# Patient Record
Sex: Male | Born: 1970 | Race: Black or African American | Hispanic: No | Marital: Single
Health system: Southern US, Community
[De-identification: ages and names within clinical notes are randomized; demographics above are authoritative.]

---

## 2002-08-28 ENCOUNTER — Encounter: Payer: Self-pay | Admitting: Emergency Medicine

## 2002-08-28 ENCOUNTER — Emergency Department (HOSPITAL_COMMUNITY): Admission: EM | Admit: 2002-08-28 | Discharge: 2002-08-28 | Payer: Self-pay | Admitting: Emergency Medicine

## 2003-05-10 ENCOUNTER — Emergency Department (HOSPITAL_COMMUNITY): Admission: AD | Admit: 2003-05-10 | Discharge: 2003-05-10 | Payer: Self-pay | Admitting: Family Medicine

## 2006-10-17 ENCOUNTER — Emergency Department: Payer: Self-pay | Admitting: Internal Medicine

## 2010-08-25 ENCOUNTER — Emergency Department: Payer: Self-pay

## 2010-08-28 ENCOUNTER — Emergency Department: Payer: Self-pay | Admitting: Emergency Medicine

## 2011-12-09 ENCOUNTER — Emergency Department: Payer: Self-pay | Admitting: Emergency Medicine

## 2012-07-20 IMAGING — CR DG SHOULDER 3+V*L*
1 series · 3 of 3 positions shown · non-contrast
Comparison: none

REASON FOR EXAM: pain
COMMENTS:

[Series 1: view not recorded · 0.17mm/px · 3 of 3 slices shown]
[im 1/3]
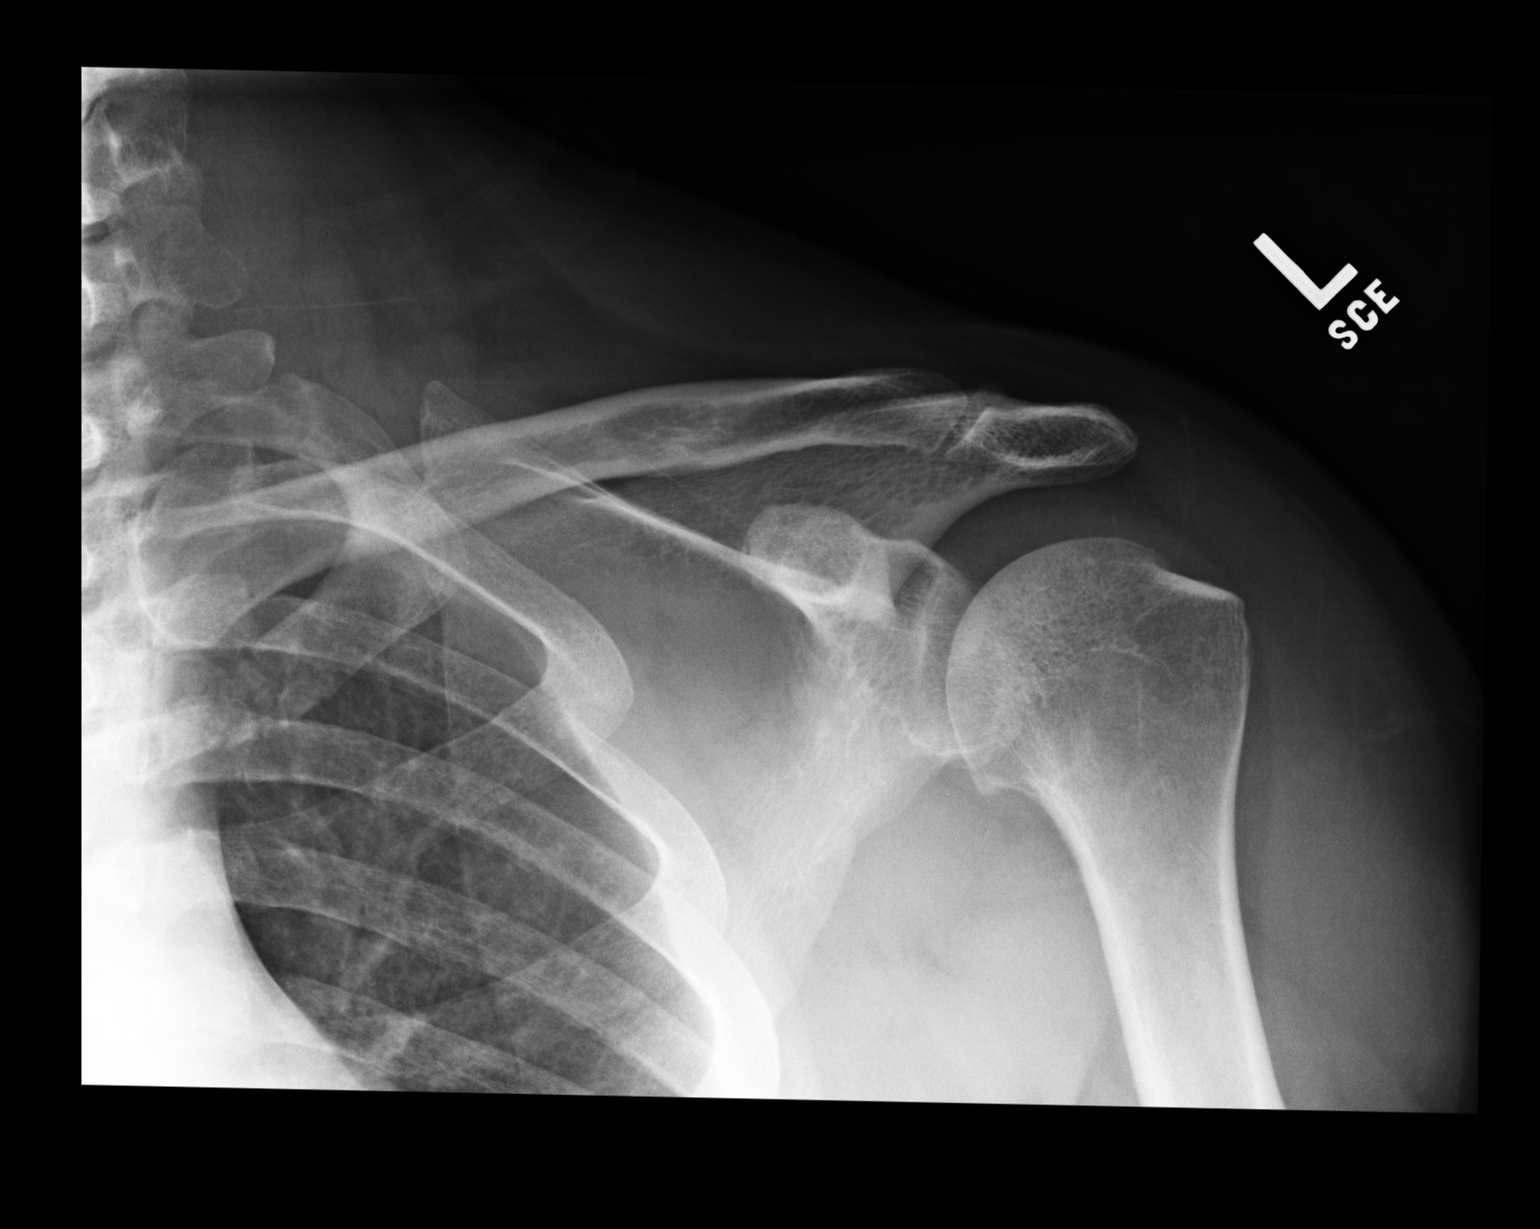
[im 2/3]
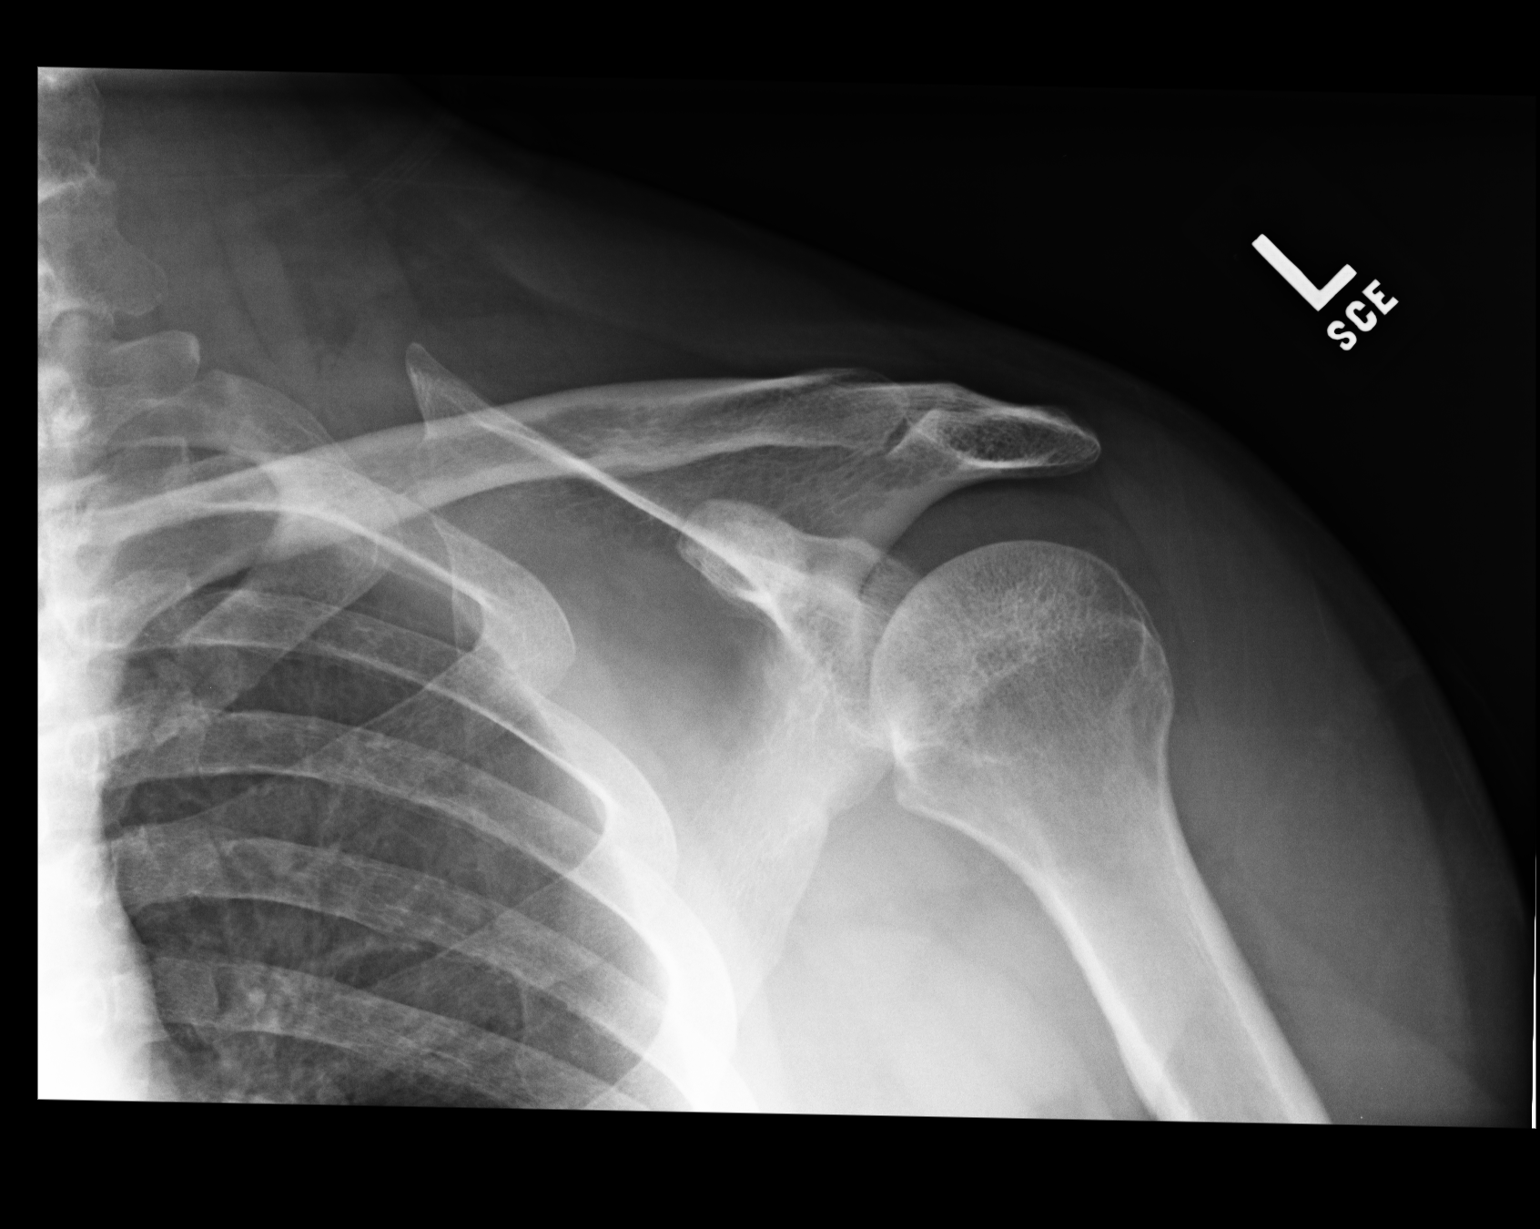
[im 3/3]
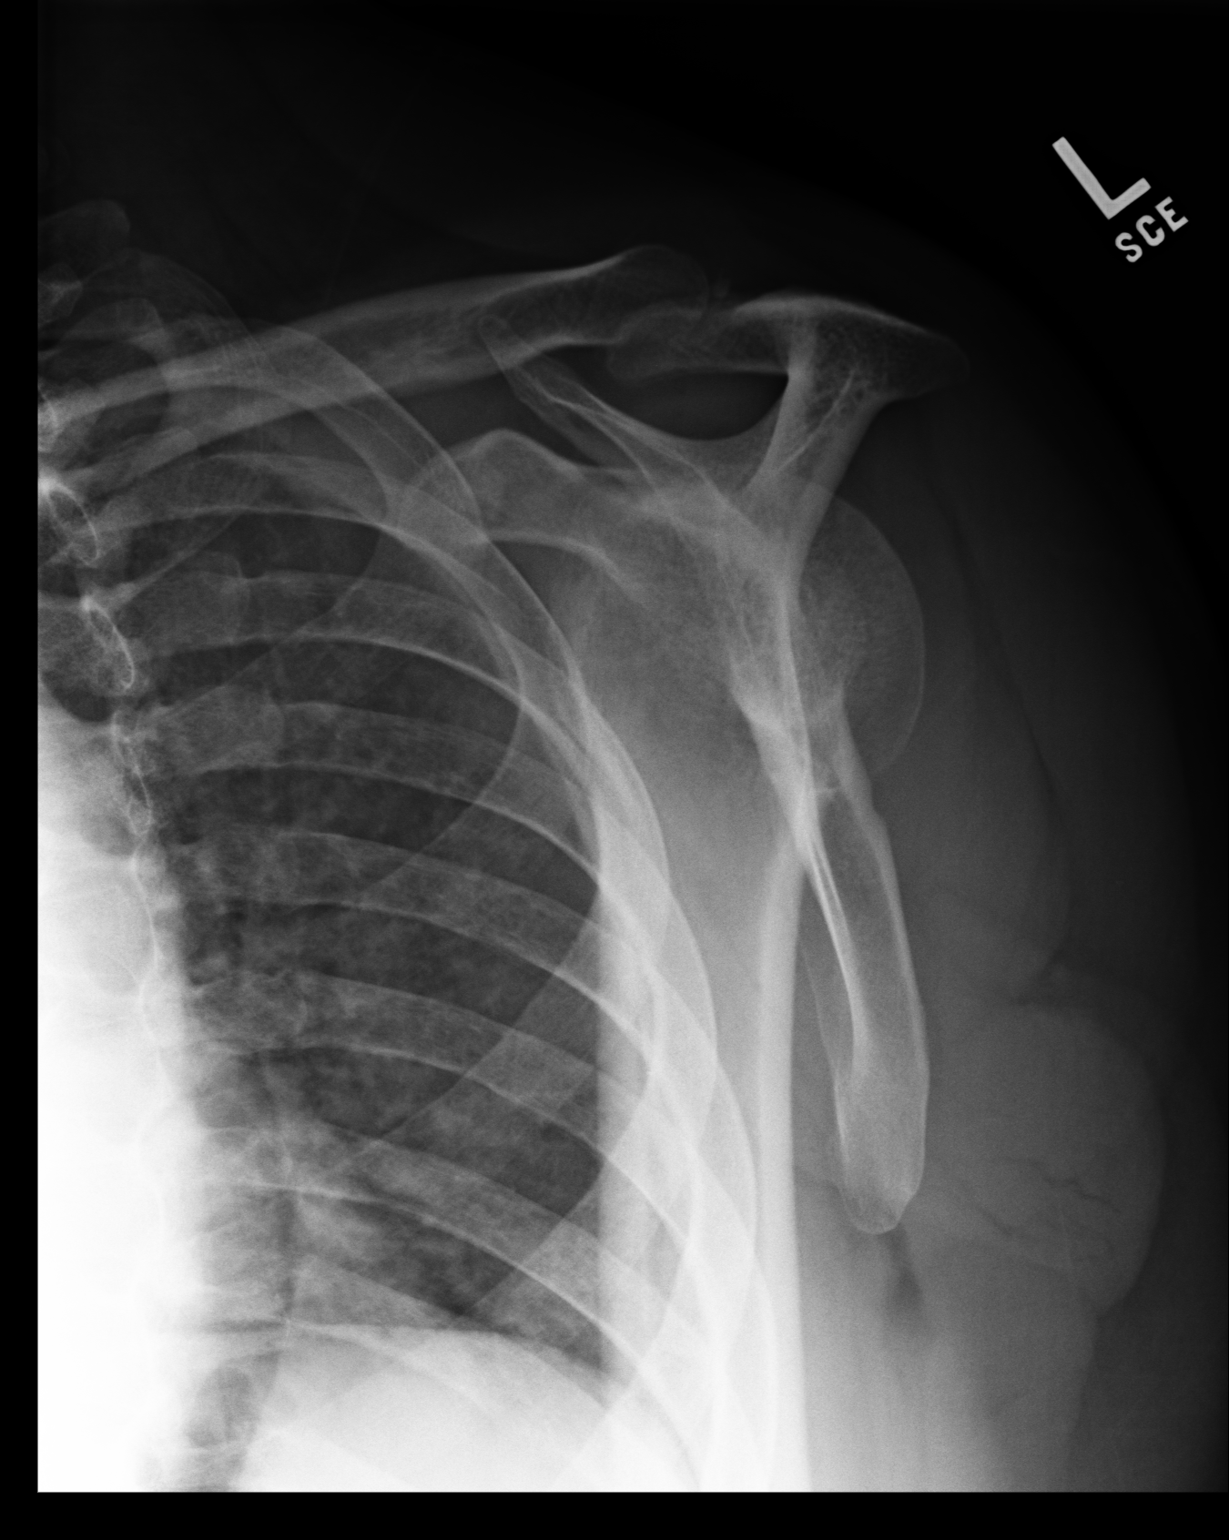

[3 of 3 positions shown; findings below may reference images not displayed]

PROCEDURE:     DXR - DXR SHOULDER LEFT COMPLETE  - August 28, 2010  [DATE]

RESULT:     Three views of the left shoulder are submitted. The bones appear
adequately mineralized. There is a small spur from the inferior margin of
the articular surface of the humeral head. The glenohumeral joint is
preserved and the AC joint appears normal.
IMPRESSION: I do not see acute bony abnormality of the left shoulder.

## 2012-07-20 IMAGING — CR DG LUMBAR SPINE 2-3V
1 series · 3 of 3 positions shown · non-contrast
Comparison: none

REASON FOR EXAM: pain
COMMENTS:

[Series 1: view not recorded · 0.17mm/px · 3 of 3 slices shown]
[im 1/3]
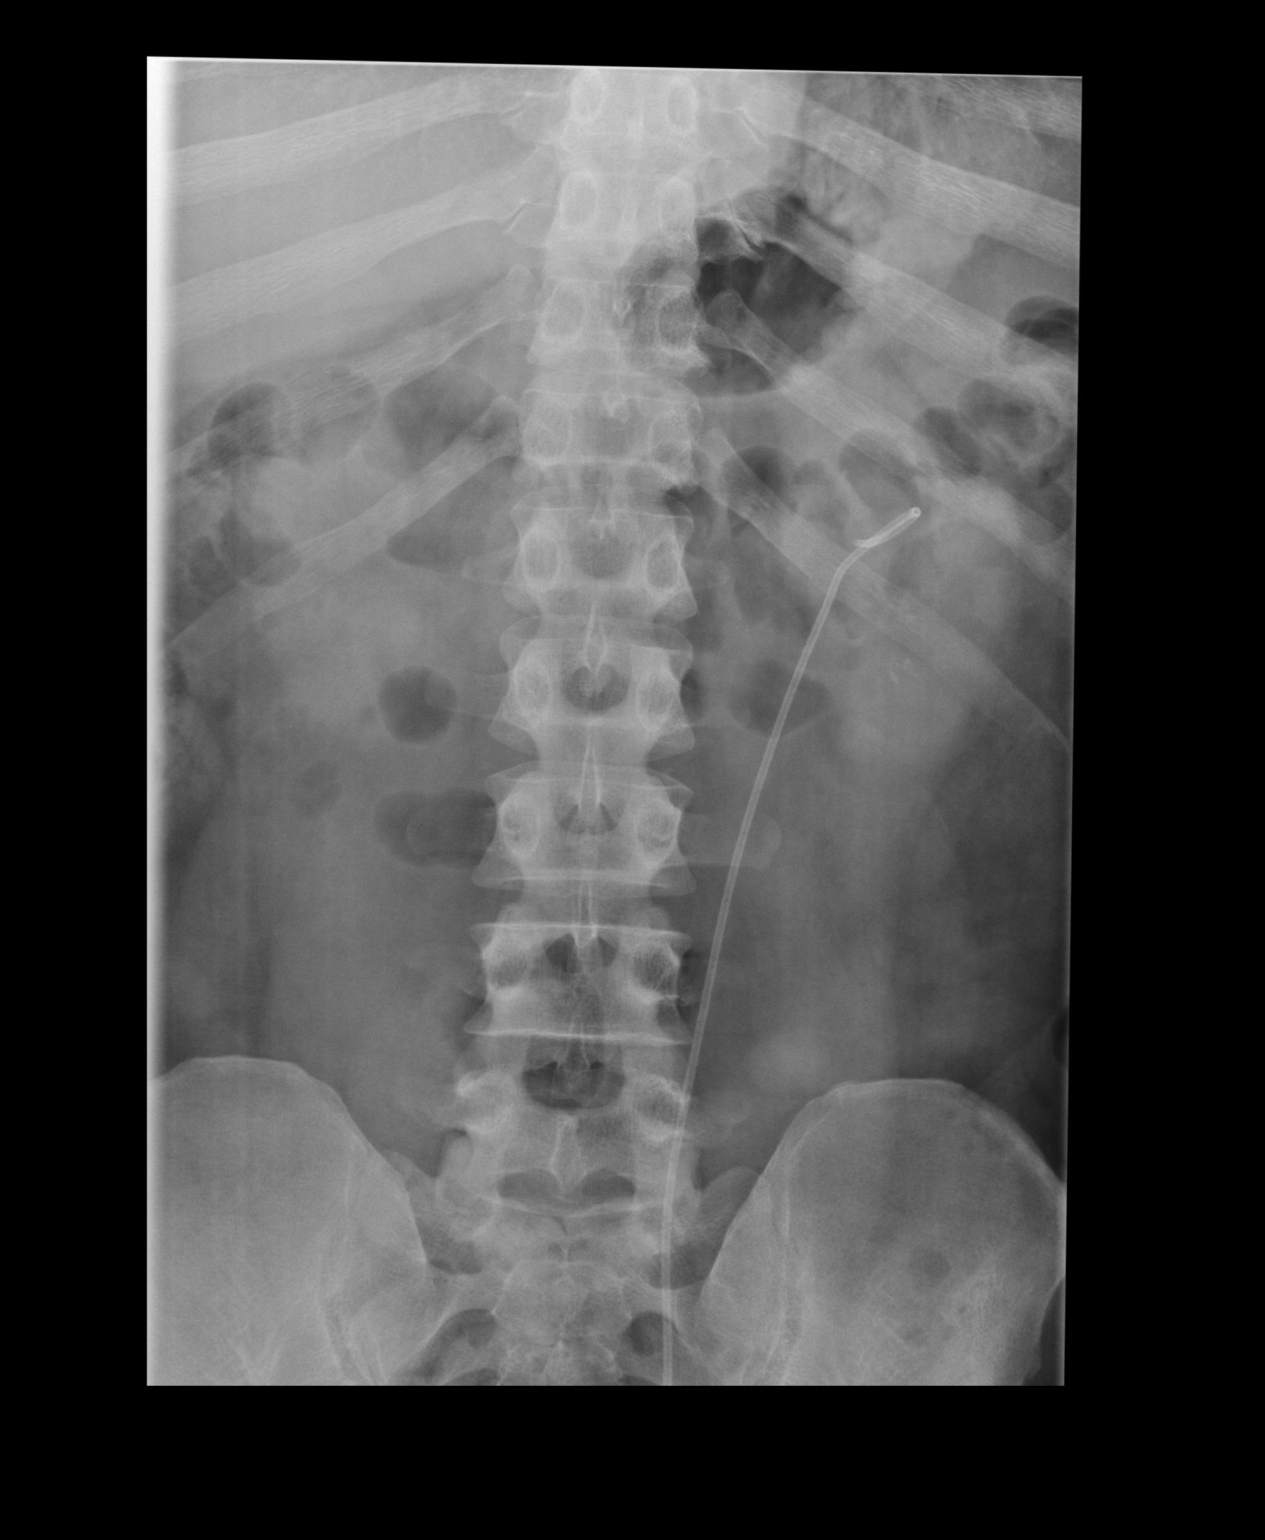
[im 2/3]
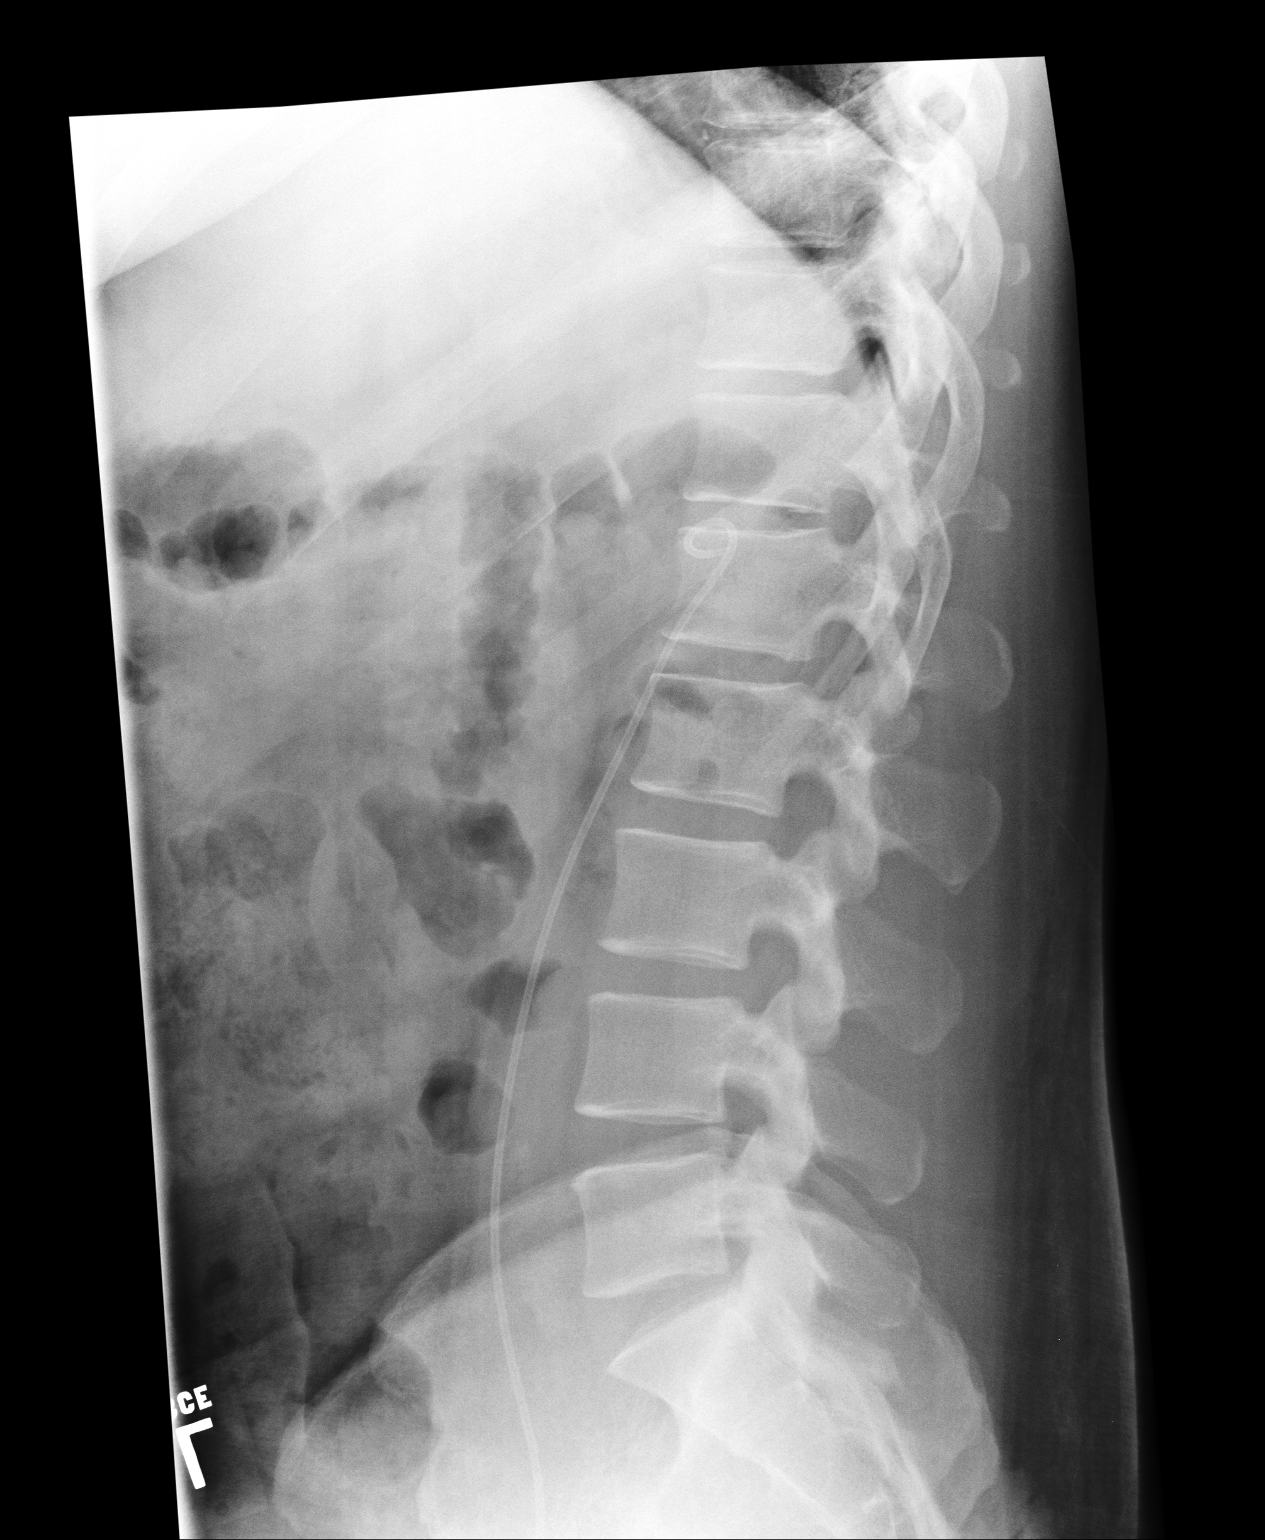
[im 3/3]
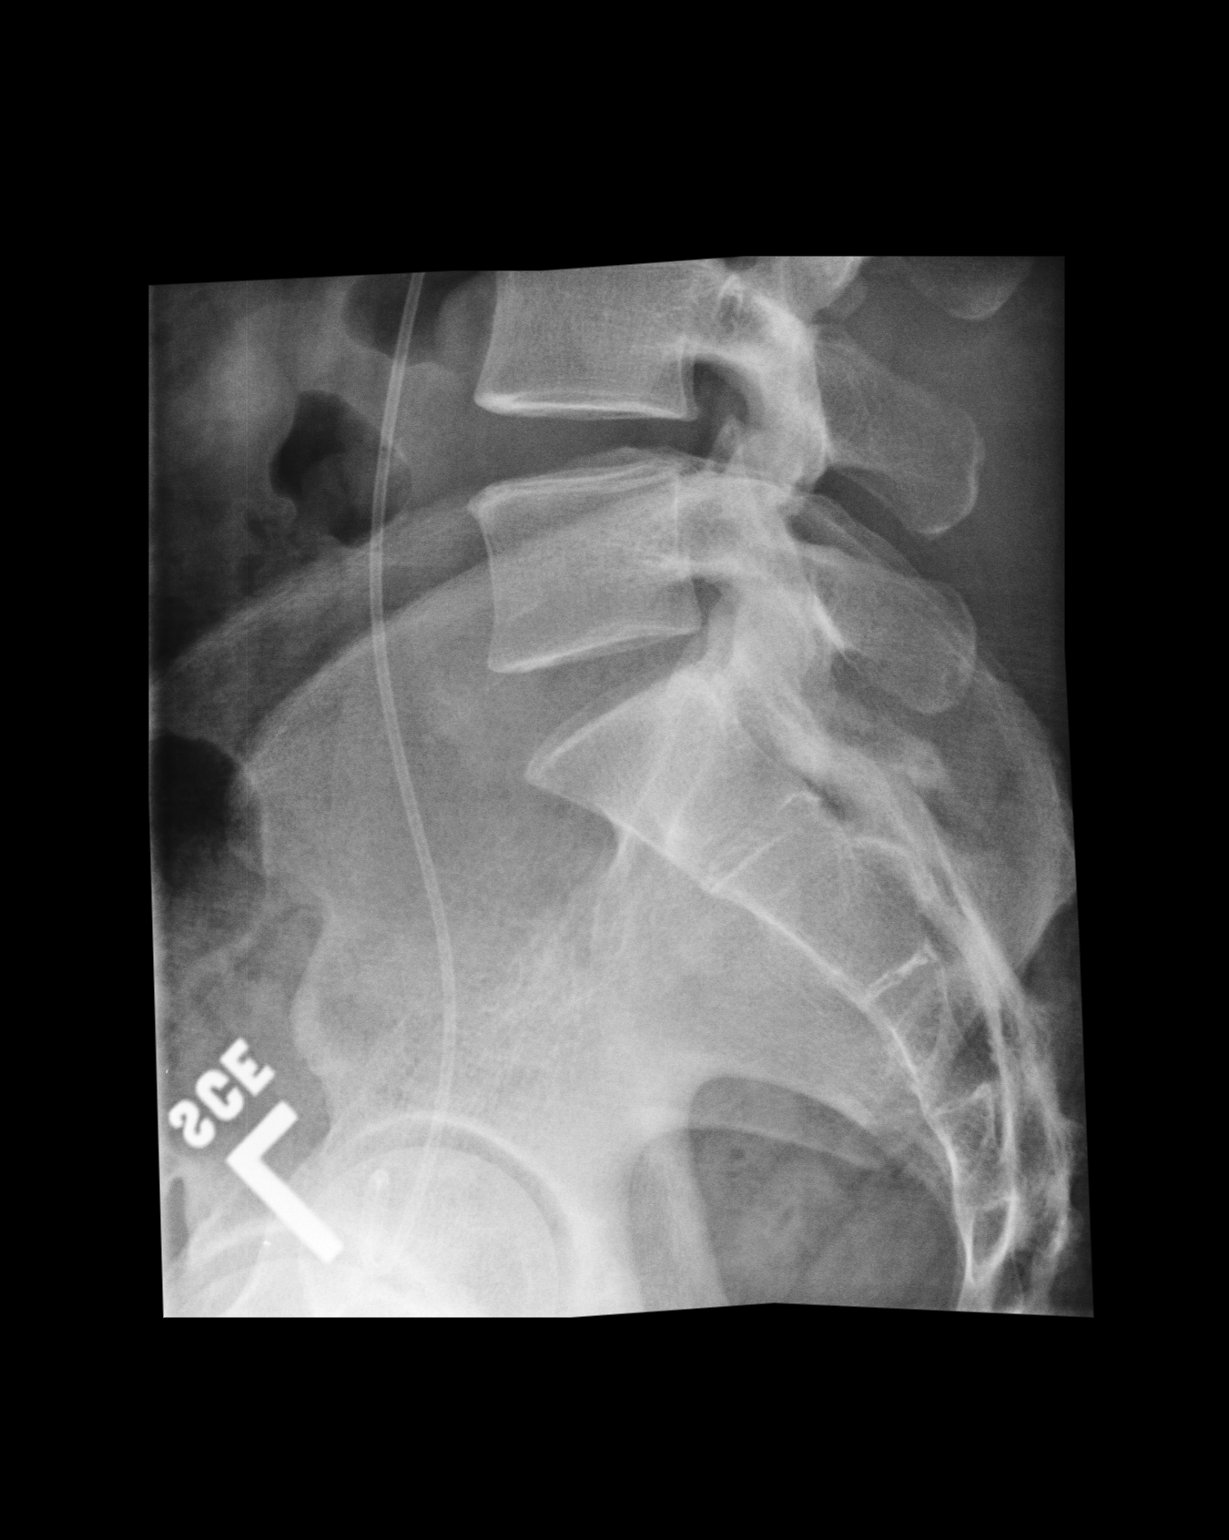

[3 of 3 positions shown; findings below may reference images not displayed]

PROCEDURE:     DXR - DXR LUMBAR SPINE AP AND LATERAL  - August 28, 2010  [DATE]

RESULT:     The lumbar vertebral bodies are preserved in height. The
intervertebral disc space heights are well-maintained. I see no
spondylolisthesis. The transverse processes and pedicles appear intact. The
spinous processes are intact. A double-J ureteral stent is in place on the
left.
IMPRESSION: I see no acute bony abnormality of the lumbar spine. There
is no evidence of significant degenerative change.

## 2013-08-09 ENCOUNTER — Ambulatory Visit: Payer: Self-pay | Admitting: Internal Medicine

## 2013-08-09 LAB — DOT URINE DIP
BLOOD: NEGATIVE
GLUCOSE, UR: NEGATIVE mg/dL (ref 0–75)
PROTEIN: NEGATIVE
Specific Gravity: 1.005 (ref 1.003–1.030)

## 2019-09-30 ENCOUNTER — Other Ambulatory Visit: Payer: Self-pay

## 2019-09-30 ENCOUNTER — Emergency Department
Admission: EM | Admit: 2019-09-30 | Discharge: 2019-09-30 | Disposition: A | Discharge status: Home / Self Care | Payer: Self-pay | Attending: Emergency Medicine | Admitting: Emergency Medicine

## 2019-09-30 DIAGNOSIS — Z79899 Other long term (current) drug therapy: Secondary | ICD-10-CM | POA: Insufficient documentation

## 2019-09-30 DIAGNOSIS — K047 Periapical abscess without sinus: Secondary | ICD-10-CM | POA: Insufficient documentation

## 2019-09-30 LAB — BASIC METABOLIC PANEL
Anion gap: 9 (ref 5–15)
BUN: 13 mg/dL (ref 6–20)
CO2: 23 mmol/L (ref 22–32)
Calcium: 8.6 mg/dL — ABNORMAL LOW (ref 8.9–10.3)
Chloride: 102 mmol/L (ref 98–111)
Creatinine, Ser: 1.18 mg/dL (ref 0.61–1.24)
GFR calc Af Amer: >60 mL/min (ref >60)
GFR calc non Af Amer: >60 mL/min (ref >60)
Glucose, Bld: 111 mg/dL — ABNORMAL HIGH (ref 70–99)
Potassium: 4.1 mmol/L (ref 3.5–5.1)
Sodium: 134 mmol/L — ABNORMAL LOW (ref 135–145)

## 2019-09-30 LAB — CBC
HCT: 38.4 % — ABNORMAL LOW (ref 39.0–52.0)
Hemoglobin: 12.9 g/dL — ABNORMAL LOW (ref 13.0–17.0)
MCH: 29 pg (ref 26.0–34.0)
MCHC: 33.6 g/dL (ref 30.0–36.0)
MCV: 86.3 fL (ref 80.0–100.0)
Platelets: 326 10*3/uL (ref 150–400)
RBC: 4.45 MIL/uL (ref 4.22–5.81)
RDW: 13.5 % (ref 11.5–15.5)
WBC: 11.5 10*3/uL — ABNORMAL HIGH (ref 4.0–10.5)
nRBC: 0 % (ref 0.0–0.2)

## 2019-09-30 MED ORDER — AMOXICILLIN-POT CLAVULANATE 875-125 MG PO TABS: 1.0000 | ORAL_TABLET | Freq: Two times a day (BID) | ORAL | 0 refills | Status: AC

## 2019-09-30 MED ORDER — TRAMADOL HCL 50 MG PO TABS: 50.0000 mg | ORAL_TABLET | Freq: Four times a day (QID) | ORAL | 0 refills | Status: DC | PRN

## 2019-09-30 MED ORDER — AMOXICILLIN-POT CLAVULANATE 875-125 MG PO TABS
1.0000 | ORAL_TABLET | Freq: Once | ORAL | Status: AC
  Administered 2019-09-30: 1 via ORAL
  Filled 2019-09-30: qty 1

## 2019-09-30 NOTE — ED Notes (Signed)
See triage note  Presents with facial swelling   States started couple of days ago   No fever

## 2019-09-30 NOTE — ED Provider Notes (Signed)
[facility] Emergency Department Provider Note   ____________________________________________    I have reviewed the triage vital signs and the nursing notes.   HISTORY  Chief Complaint Facial Swelling     HPI David Huerta is a 49 y.o. male who presents with complaints of right-sided facial swelling and discomfort.  Patient reports symptoms started several days ago, does report tenderness in the area and along lower lateral teeth.  Denies fevers or chills.  No difficulty swallowing.  No tongue swelling.  Reports similar dental pain in the past.  Has not take anything for this  History reviewed. No pertinent past medical history.  There are no problems to display for this patient.   History reviewed. No pertinent surgical history.  Prior to Admission medications   Medication Sig Start Date End Date Taking? Authorizing Provider  amoxicillin-clavulanate (AUGMENTIN) 875-125 MG tablet Take 1 tablet by mouth 2 (two) times daily for 7 days. 09/30/19 10/07/19  Jene Every, MD  traMADol (ULTRAM) 50 MG tablet Take 1 tablet (50 mg total) by mouth every 6 (six) hours as needed. 09/30/19 09/29/20  Jene Every, MD     Allergies Patient has no allergy information on record.  No family history on file.  Social History Social History   Tobacco Use  . Smoking status: Not on file  Substance Use Topics  . Alcohol use: Not on file  . Drug use: Not on file    Review of Systems  Constitutional: No fever/chills  ENT: As above   Gastrointestinal: No nausea, no vomiting.    Skin: Negative for rash. Neurological: Negative for headaches     ____________________________________________   PHYSICAL EXAM:  VITAL SIGNS: ED Triage Vitals  Enc Vitals Group     BP 09/30/19 0745 (!) 146/80     Pulse Rate 09/30/19 0745 99     Resp 09/30/19 0745 17     Temp 09/30/19 0745 98.8 F (37.1 C)     Temp Source 09/30/19 0745 Oral     SpO2 09/30/19 0745 97  %     Weight 09/30/19 0745 83.9 kg (185 lb)     Height 09/30/19 0745 1.676 m (5\' 6" )     Head Circumference --      Peak Flow --      Pain Score 09/30/19 0750 7     Pain Loc --      Pain Edu? --      Excl. in GC? --     Constitutional: Alert and oriented. No acute distress. Pleasant and interactive Eyes: Conjunctivae are normal.  Head: Atraumatic. Nose: No congestion/rhinnorhea. Mouth/Throat: Mucous membranes are moist.  Tenderness along right inferior gumline, no drainable abscess, pharynx normal, floor of mouth soft Cardiovascular: Normal rate, regular rhythm.  Respiratory: Normal respiratory effort.  No retractions.  Musculoskeletal: No lower extremity tenderness nor edema.   Neurologic:  Normal speech and language. No gross focal neurologic deficits are appreciated.   Skin:  Skin is warm, dry and intact. No rash noted.   ____________________________________________   LABS (all labs ordered are listed, but only abnormal results are displayed)  Labs Reviewed  CBC - Abnormal; Notable for the following components:      Result Value   WBC 11.5 (*)    Hemoglobin 12.9 (*)    HCT 38.4 (*)    All other components within normal limits  BASIC METABOLIC PANEL - Abnormal; Notable for the following components:   Sodium 134 (*)  Glucose, Bld 111 (*)    Calcium 8.6 (*)    All other components within normal limits   ____________________________________________  EKG   ____________________________________________  RADIOLOGY  None ____________________________________________   PROCEDURES  Procedure(s) performed: No  Procedures   Critical Care performed: No ____________________________________________   INITIAL IMPRESSION / ASSESSMENT AND PLAN / ED COURSE  Pertinent labs & imaging results that were available during my care of the patient were reviewed by me and considered in my medical decision making (see chart for details).  Patient presents with right-sided  facial swelling as described above.  Suspected odontogenic infection, less likely sialoadenitis  No evidence of abscess on exam, discussed with patient CT imaging, he would prefer to try antibiotics first which is reasonable.  Return precautions discussed  ____________________________________________   FINAL CLINICAL IMPRESSION(S) / ED DIAGNOSES  Final diagnoses:  Dental infection      NEW MEDICATIONS STARTED DURING THIS VISIT:  Discharge Medication List as of 09/30/2019 10:34 AM    START taking these medications   Details  amoxicillin-clavulanate (AUGMENTIN) 875-125 MG tablet Take 1 tablet by mouth 2 (two) times daily for 7 days., Starting Mon 09/30/2019, Until Mon 10/07/2019, Print    traMADol (ULTRAM) 50 MG tablet Take 1 tablet (50 mg total) by mouth every 6 (six) hours as needed., Starting Mon 09/30/2019, Until Tue 09/29/2020 at 2359, Print         Note:  This document was prepared using Dragon voice recognition software and may include unintentional dictation errors.   Jene Every, MD 09/30/19 1054

## 2019-09-30 NOTE — ED Triage Notes (Signed)
Pt comes via POV from home with c/o right sided facial swelling the patient states this has been going on for two days. Pt states it feels like it is in his jaw.

## 2019-10-14 ENCOUNTER — Ambulatory Visit: Payer: Self-pay | Attending: Internal Medicine

## 2019-10-14 DIAGNOSIS — Z23 Encounter for immunization: Secondary | ICD-10-CM

## 2019-10-14 NOTE — Progress Notes (Signed)
   Covid-19 Vaccination Clinic  Name:  David Huerta    MRN: [id number] DOB: [birth year]  10/14/2019  [name] was observed post Covid-19 immunization for 15 minutes without incident. He was provided with Vaccine Information Sheet and instruction to access the V-Safe system.   [name] was instructed to call 911 with any severe reactions post vaccine: Marland Kitchen Difficulty breathing  . Swelling of face and throat  . A fast heartbeat  . A bad rash all over body  . Dizziness and weakness   Immunizations Administered    Name Date Dose VIS Date Route   Pfizer COVID-19 Vaccine 10/14/2019 10:10 AM 0.3 mL 04/10/2018 Intramuscular   Manufacturer: [employer], [employer]   Lot: [id number]   NDC: [address]-9

## 2019-11-04 ENCOUNTER — Ambulatory Visit: Payer: Self-pay | Attending: Internal Medicine

## 2019-11-04 DIAGNOSIS — Z23 Encounter for immunization: Secondary | ICD-10-CM

## 2019-11-04 NOTE — Progress Notes (Signed)
   Covid-19 Vaccination Clinic  Name:  David Huerta    MRN: [id number] DOB: 16-Oct-1970  11/04/2019  Mr. David Huerta was observed post Covid-19 immunization for 15 minutes without incident. He was provided with Vaccine Information Sheet and instruction to access the V-Safe system.   Mr. David Huerta was instructed to call 911 with any severe reactions post vaccine: Marland Kitchen Difficulty breathing  . Swelling of face and throat  . A fast heartbeat  . A bad rash all over body  . Dizziness and weakness   Immunizations Administered    Name Date Dose VIS Date Route   Pfizer COVID-19 Vaccine 11/04/2019 10:07 AM 0.3 mL 04/10/2018 Intramuscular   Manufacturer: [employer], [employer]   Lot: [id number]   NDC: [address]-6

## 2020-04-29 ENCOUNTER — Encounter: Payer: Self-pay | Admitting: Adult Health

## 2020-04-29 ENCOUNTER — Other Ambulatory Visit: Payer: Self-pay

## 2020-04-29 ENCOUNTER — Ambulatory Visit: Payer: Medicaid Other | Admitting: Adult Health

## 2020-04-29 VITALS — BP 127/83 | HR 89 | Temp 98.3°F | Resp 16 | Wt 211.1 lb

## 2020-04-29 DIAGNOSIS — Z7689 Persons encountering health services in other specified circumstances: Secondary | ICD-10-CM

## 2020-04-29 DIAGNOSIS — K0889 Other specified disorders of teeth and supporting structures: Secondary | ICD-10-CM

## 2020-04-29 NOTE — Progress Notes (Signed)
New Patient Office Visit  Subjective:  Patient ID: David Huerta, male    DOB: 10-May-1970  Age: 50 y.o. MRN: [id number]  CC: No chief complaint on file.   HPI David Huerta 50 y/o male who has no significant past medical history, presents to establish care . He states that he's doing well . Currently, he c/o nocturia that started 2 weeks ago. He denies any urinary frequency, urgency, flank nor pelvic pain. He also c/o intermittent dental ache to his right lower manidibular teeth that has been going on for many months. He denies jaw pain, erythema or swelling. Overall, he states that he's doing well and offers no further complaint.        Social History   Socioeconomic History  . Marital status: Single    Spouse name: Not on file  . Number of children: Not on file  . Years of education: Not on file  . Highest education level: Not on file  Occupational History  . Not on file  Tobacco Use  . Smoking status: Not on file  . Smokeless tobacco: Not on file  Substance and Sexual Activity  . Alcohol use: Not on file  . Drug use: Not on file  . Sexual activity: Not on file  Other Topics Concern  . Not on file  Social History Narrative  . Not on file   Social Determinants of Health   Financial Resource Strain: Not on file  Food Insecurity: Not on file  Transportation Needs: Not on file  Physical Activity: Not on file  Stress: Not on file  Social Connections: Not on file  Intimate Partner Violence: Not on file    ROS Review of Systems  Constitutional: Negative.   HENT: Positive for dental problem.   Eyes: Negative.   Respiratory: Negative.   Cardiovascular: Negative.   Gastrointestinal: Negative.   Endocrine: Positive for polyuria.  Genitourinary: Negative.   Musculoskeletal: Negative.   Skin: Negative.   Neurological: Negative.   Hematological: Negative.   Psychiatric/Behavioral: Negative.     Objective:   Today's Vitals: BP 127/83 (BP Location: Right  Arm, Patient Position: Sitting, Cuff Size: Large)   Pulse 89   Temp 98.3 F (36.8 C)   Resp 16   Wt 211 lb 1.6 oz (95.8 kg)   SpO2 98%   BMI 34.07 kg/m   Physical Exam HENT:     Head: Normocephalic and atraumatic.     Mouth/Throat:     Lips: Pink.     Mouth: Mucous membranes are moist.     Dentition: Dental caries present. No dental tenderness, gingival swelling or dental abscesses.   Cardiovascular:     Rate and Rhythm: Normal rate and regular rhythm.     Pulses: Normal pulses.     Heart sounds: Normal heart sounds.  Pulmonary:     Effort: Pulmonary effort is normal.     Breath sounds: Normal breath sounds.  Abdominal:     General: Abdomen is flat. Bowel sounds are normal.     Palpations: Abdomen is soft.  Genitourinary:    Comments: Deferred per patient. Musculoskeletal:        General: Normal range of motion.     Cervical back: Normal range of motion.  Skin:    General: Skin is warm and dry.  Neurological:     General: No focal deficit present.     Mental Status: He is alert and oriented to person, place, and time. Mental status is at  baseline.  Psychiatric:        Mood and Affect: Mood normal.        Behavior: Behavior normal.        Thought Content: Thought content normal.        Judgment: Judgment normal.     Assessment & Plan:    1. Encounter to establish care - Routine lab will be checked. - CBC w/Diff; Future - Lipid panel; Future - Comp Met (CMET); Future - HgB A1c; Future - Urinalysis; Future - Urinalysis - HgB A1c - Comp Met (CMET) - Lipid panel - CBC w/Diff - PSA   2. Tooth ache - No erythema, swelling, tooth decay to right lower mandibular tooth. - Dental application provided -     Follow-up: Return in about 15 days (around 05/14/2020), or if symptoms worsen or fail to improve.   David Langsam Jerold Coombe, NP

## 2020-04-30 LAB — SPECIMEN STATUS

## 2020-04-30 LAB — URINALYSIS
Bilirubin, UA: NEGATIVE
Glucose, UA: NEGATIVE
Ketones, UA: NEGATIVE
Leukocytes,UA: NEGATIVE
Nitrite, UA: NEGATIVE
Protein,UA: NEGATIVE
RBC, UA: NEGATIVE
Specific Gravity, UA: 1.022 (ref 1.005–1.030)
Urobilinogen, Ur: 1 mg/dL (ref 0.2–1.0)
pH, UA: 7 (ref 5.0–7.5)

## 2020-05-01 LAB — CBC WITH DIFFERENTIAL/PLATELET
Basophils Absolute: 0 10*3/uL (ref 0.0–0.2)
Basos: 0 %
EOS (ABSOLUTE): 0.1 10*3/uL (ref 0.0–0.4)
Eos: 1 %
Hematocrit: 41.9 % (ref 37.5–51.0)
Hemoglobin: 14 g/dL (ref 13.0–17.7)
Immature Grans (Abs): 0 10*3/uL (ref 0.0–0.1)
Immature Granulocytes: 0 %
Lymphocytes Absolute: 2.3 10*3/uL (ref 0.7–3.1)
Lymphs: 26 %
MCH: 28.6 pg (ref 26.6–33.0)
MCHC: 33.4 g/dL (ref 31.5–35.7)
MCV: 86 fL (ref 79–97)
Monocytes Absolute: 0.6 10*3/uL (ref 0.1–0.9)
Monocytes: 7 %
Neutrophils Absolute: 5.8 10*3/uL (ref 1.4–7.0)
Neutrophils: 66 %
Platelets: 388 10*3/uL (ref 150–450)
RBC: 4.89 x10E6/uL (ref 4.14–5.80)
RDW: 13.6 % (ref 11.6–15.4)
WBC: 8.9 10*3/uL (ref 3.4–10.8)

## 2020-05-01 LAB — COMPREHENSIVE METABOLIC PANEL
ALT: 14 IU/L (ref 0–44)
AST: 17 IU/L (ref 0–40)
Albumin/Globulin Ratio: 1.7 (ref 1.2–2.2)
Albumin: 3.8 g/dL — ABNORMAL LOW (ref 4.0–5.0)
Alkaline Phosphatase: 59 IU/L (ref 44–121)
BUN/Creatinine Ratio: 13 (ref 9–20)
BUN: 16 mg/dL (ref 6–24)
Bilirubin Total: 0.3 mg/dL (ref 0.0–1.2)
CO2: 20 mmol/L (ref 20–29)
Calcium: 8.9 mg/dL (ref 8.7–10.2)
Chloride: 102 mmol/L (ref 96–106)
Creatinine, Ser: 1.23 mg/dL (ref 0.76–1.27)
Globulin, Total: 2.3 g/dL (ref 1.5–4.5)
Glucose: 105 mg/dL — ABNORMAL HIGH (ref 65–99)
Potassium: 4.7 mmol/L (ref 3.5–5.2)
Sodium: 137 mmol/L (ref 134–144)
Total Protein: 6.1 g/dL (ref 6.0–8.5)
eGFR: 72 mL/min/{1.73_m2} (ref >59)

## 2020-05-01 LAB — HEMOGLOBIN A1C
Est. average glucose Bld gHb Est-mCnc: 128 mg/dL
Hgb A1c MFr Bld: 6.1 % — ABNORMAL HIGH (ref 4.8–5.6)

## 2020-05-01 LAB — LIPID PANEL
Chol/HDL Ratio: 5.6 ratio — ABNORMAL HIGH (ref 0.0–5.0)
Cholesterol, Total: 192 mg/dL (ref 100–199)
HDL: 34 mg/dL — ABNORMAL LOW (ref >39)
LDL Chol Calc (NIH): 129 mg/dL — ABNORMAL HIGH (ref 0–99)
Triglycerides: 161 mg/dL — ABNORMAL HIGH (ref 0–149)
VLDL Cholesterol Cal: 29 mg/dL (ref 5–40)

## 2020-05-01 LAB — SPECIMEN STATUS REPORT

## 2020-05-01 LAB — PSA: Prostate Specific Ag, Serum: 0.4 ng/mL (ref 0.0–4.0)

## 2020-05-14 ENCOUNTER — Ambulatory Visit: Payer: Medicaid Other | Admitting: Gerontology

## 2020-05-14 ENCOUNTER — Other Ambulatory Visit: Payer: Self-pay | Admitting: Gerontology

## 2020-05-14 ENCOUNTER — Other Ambulatory Visit: Payer: Self-pay

## 2020-05-14 ENCOUNTER — Encounter: Payer: Self-pay | Admitting: Gerontology

## 2020-05-14 VITALS — BP 127/82 | HR 96 | Temp 97.1°F | Ht 67.5 in | Wt 218.2 lb

## 2020-05-14 DIAGNOSIS — K0889 Other specified disorders of teeth and supporting structures: Secondary | ICD-10-CM

## 2020-05-14 DIAGNOSIS — F17200 Nicotine dependence, unspecified, uncomplicated: Secondary | ICD-10-CM

## 2020-05-14 DIAGNOSIS — Z0000 Encounter for general adult medical examination without abnormal findings: Secondary | ICD-10-CM

## 2020-05-14 DIAGNOSIS — E785 Hyperlipidemia, unspecified: Secondary | ICD-10-CM

## 2020-05-14 DIAGNOSIS — R7303 Prediabetes: Secondary | ICD-10-CM

## 2020-05-14 MED ORDER — ROSUVASTATIN CALCIUM 5 MG PO TABS: 5.0000 mg | ORAL_TABLET | Freq: Every day | ORAL | 0 refills | Status: DC

## 2020-05-14 NOTE — Patient Instructions (Signed)

## 2020-05-14 NOTE — Progress Notes (Signed)
Established Patient Office Visit  Subjective:  Patient ID: TOD ABRAHAMSEN, male    DOB: 1970/06/25  Age: 50 y.o. MRN: [id number]  CC: F/U visit  HPI David Huerta 50 y/o male who presents for routine follow up visit and lab review. During his last visit, he c/o tooth ache , states that he needs to schedule appointment in April, and continues to take Aleve as needed with moderate relief. His HgbA1c done on 04/29/20, was 6.1%, Triglycerides 161 mg/dl, HDL 34 and LDL was 622 mg/dl and his CBC was not released. He continues to smoke 1 pack of cigarette in 2 days and admits the desire to quit. He has not had Colonoscopy done, states that he will wait for his Financial application to be approved. Overall, he states that he's doing well and offers no further complaint.        Social History   Socioeconomic History  . Marital status: Single    Spouse name: Not on file  . Number of children: Not on file  . Years of education: Not on file  . Highest education level: Not on file  Occupational History  . Not on file  Tobacco Use  . Smoking status: Current Every Day Smoker    Types: Cigarettes  . Smokeless tobacco: Not on file  Substance and Sexual Activity  . Alcohol use: Not on file  . Drug use: Not on file  . Sexual activity: Not on file  Other Topics Concern  . Not on file  Social History Narrative  . Not on file   Social Determinants of Health   Financial Resource Strain: Not on file  Food Insecurity: Not on file  Transportation Needs: Not on file  Physical Activity: Not on file  Stress: Not on file  Social Connections: Not on file  Intimate Partner Violence: Not on file    No outpatient medications prior to visit.   No facility-administered medications prior to visit.    Not on File  ROS Review of Systems  Constitutional: Negative.   HENT: Positive for dental problem.   Eyes: Negative.   Respiratory: Negative.   Cardiovascular: Negative.   Neurological:  Negative.   Psychiatric/Behavioral: Negative.       Objective:    Physical Exam Cardiovascular:     Rate and Rhythm: Normal rate and regular rhythm.     Pulses: Normal pulses.     Heart sounds: Normal heart sounds.  Pulmonary:     Effort: Pulmonary effort is normal.     Breath sounds: Normal breath sounds.  Skin:    General: Skin is warm.  Neurological:     General: No focal deficit present.     Mental Status: He is alert and oriented to person, place, and time. Mental status is at baseline.  Psychiatric:        Mood and Affect: Mood normal.        Behavior: Behavior normal.        Thought Content: Thought content normal.        Judgment: Judgment normal.     BP 127/82 (BP Location: Left Arm, Patient Position: Sitting)   Pulse 96   Temp (!) 97.1 F (36.2 C)   Ht 5' 7.5" (1.715 m)   Wt 218 lb 3.2 oz (99 kg)   SpO2 97%   BMI 33.67 kg/m  Wt Readings from Last 3 Encounters:  05/14/20 218 lb 3.2 oz (99 kg)  04/29/20 211 lb 1.6 oz (95.8 kg)  09/30/19 185 lb (83.9 kg)   Weight loss was encouraged  Health Maintenance Due  Topic Date Due  . Hepatitis C Screening  Never done  . HIV Screening  Never done  . TETANUS/TDAP  Never done  . COLONOSCOPY (Pts 45-20yrs Insurance coverage will need to be confirmed)  Never done  . INFLUENZA VACCINE  Never done  . COVID-19 Vaccine (3 - Booster for Pfizer series) 05/03/2020    There are no preventive care reminders to display for this patient.  No results found for: TSH Lab Results  Component Value Date   WBC WILL FOLLOW 04/29/2020   WBC 8.9 04/29/2020   HGB WILL FOLLOW 04/29/2020   HGB 14.0 04/29/2020   HCT WILL FOLLOW 04/29/2020   HCT 41.9 04/29/2020   MCV WILL FOLLOW 04/29/2020   MCV 86 04/29/2020   PLT WILL FOLLOW 04/29/2020   PLT 388 04/29/2020   Lab Results  Component Value Date   NA 137 04/29/2020   K 4.7 04/29/2020   CO2 20 04/29/2020   GLUCOSE 105 (H) 04/29/2020   BUN 16 04/29/2020   CREATININE 1.23  04/29/2020   BILITOT 0.3 04/29/2020   ALKPHOS 59 04/29/2020   AST 17 04/29/2020   ALT 14 04/29/2020   PROT 6.1 04/29/2020   ALBUMIN 3.8 (L) 04/29/2020   CALCIUM 8.9 04/29/2020   ANIONGAP 9 09/30/2019   Lab Results  Component Value Date   CHOL 192 04/29/2020   Lab Results  Component Value Date   HDL 34 (L) 04/29/2020   Lab Results  Component Value Date   LDLCALC 129 (H) 04/29/2020   Lab Results  Component Value Date   TRIG 161 (H) 04/29/2020   Lab Results  Component Value Date   CHOLHDL 5.6 (H) 04/29/2020   Lab Results  Component Value Date   HGBA1C 6.1 (H) 04/29/2020      Assessment & Plan:    1. Prediabetes - His HgbA1c was 6.1%, he declines Metformin therapy stating that he will work on his diet. He was encouraged to continue on low carb/ non concentrated sweet diet and exercise as tolerated.  - HgB A1c; Future  2. Tooth ache - He was advised to call and schedule his Dental appointment tomorrow.  3. Elevated lipids -The 10-year ASCVD risk score Denman George DC Jr., et al., 2013) is: 9.8%   Values used to calculate the score:     Age: 50 years     Sex: Male     Is Non-Hispanic African American: Yes     Diabetic: No     Tobacco smoker: Yes     Systolic Blood Pressure: 127 mmHg     Is BP treated: No     HDL Cholesterol: 34 mg/dL     Total Cholesterol: 192 mg/dL His 10 year risk factor was 9.8%, he was started on 5 mg Crestor, was educated on medication side effects and was advised to notify clinic. - rosuvastatin (CRESTOR) 5 MG tablet; Take 1 tablet (5 mg total) by mouth daily.  Dispense: 30 tablet; Refill: 0 - Lipid panel; Future  4. Health care maintenance - Will check Vit D and will send referral for Colonoscopy - Vitamin D (25 hydroxy); Future  5. Smoking - He was advised on smoking cessation, provided with [place] Quit line information.     Follow-up: Return in about 4 weeks (around 06/11/2020), or if symptoms worsen or fail to improve.    Rajvir Ernster Trellis Paganini, NP

## 2020-05-22 ENCOUNTER — Other Ambulatory Visit: Payer: Self-pay

## 2020-05-22 ENCOUNTER — Other Ambulatory Visit: Payer: Medicaid Other

## 2020-05-27 ENCOUNTER — Other Ambulatory Visit: Payer: Self-pay

## 2020-05-27 ENCOUNTER — Ambulatory Visit: Payer: Medicaid Other | Admitting: Gerontology

## 2020-05-27 VITALS — BP 143/85 | HR 87 | Resp 16 | Wt 220.0 lb

## 2020-05-27 DIAGNOSIS — K047 Periapical abscess without sinus: Secondary | ICD-10-CM | POA: Insufficient documentation

## 2020-05-27 MED ORDER — AZITHROMYCIN 250 MG PO TABS
ORAL_TABLET | ORAL | 0 refills | Status: AC
Start: 2020-05-27 — End: ?
  Filled 2020-05-27: qty 6, 4d supply, fill #0

## 2020-05-27 NOTE — Progress Notes (Signed)
Established Patient Office Visit  Subjective:  Patient ID: David Huerta, male    DOB: [birth year]  Age: 50 y.o. MRN: [id number]  CC: Tooth ache  HPI FAIZ WEBER presents for c/o worsening tooth ache . He states that his Dental appointment is scheduled for 07/08/20, and he continues to experience pain to right lower mandibular , erythema and swelling. He states that he can't chew on his right side. He denies fever and chills. Overall, he states that he's doing well and offers no further complaint.        Social History   Socioeconomic History  . Marital status: Single    Spouse name: Not on file  . Number of children: Not on file  . Years of education: Not on file  . Highest education level: Not on file  Occupational History  . Not on file  Tobacco Use  . Smoking status: Current Every Day Smoker    Types: Cigarettes  . Smokeless tobacco: Not on file  Substance and Sexual Activity  . Alcohol use: Not on file  . Drug use: Not on file  . Sexual activity: Not on file  Other Topics Concern  . Not on file  Social History Narrative  . Not on file   Social Determinants of Health   Financial Resource Strain: Not on file  Food Insecurity: Not on file  Transportation Needs: Not on file  Physical Activity: Not on file  Stress: Not on file  Social Connections: Not on file  Intimate Partner Violence: Not on file    Outpatient Medications Prior to Visit  Medication Sig Dispense Refill  . rosuvastatin (CRESTOR) 5 MG tablet TAKE ONE TABLET BY MOUTH EVERY DAY 30 tablet 0   No facility-administered medications prior to visit.    Not on File  ROS Review of Systems  Constitutional: Negative.   HENT: Positive for dental problem.   Respiratory: Negative.   Cardiovascular: Negative.   Neurological: Negative.   Psychiatric/Behavioral: Negative.       Objective:    Physical Exam HENT:     Mouth/Throat:     Dentition: Dental caries present. No dental abscesses.    Cardiovascular:     Rate and Rhythm: Normal rate and regular rhythm.     Pulses: Normal pulses.     Heart sounds: Normal heart sounds.  Pulmonary:     Effort: Pulmonary effort is normal.     Breath sounds: Normal breath sounds.  Neurological:     General: No focal deficit present.     Mental Status: He is alert and oriented to person, place, and time. Mental status is at baseline.  Psychiatric:        Mood and Affect: Mood normal.        Behavior: Behavior normal.        Thought Content: Thought content normal.        Judgment: Judgment normal.     BP (!) 143/85 (BP Location: Left Arm, Patient Position: Sitting, Cuff Size: Large)   Pulse 87   Resp 16   Wt 220 lb (99.8 kg)   SpO2 94%   BMI 33.95 kg/m  Wt Readings from Last 3 Encounters:  05/27/20 220 lb (99.8 kg)  05/14/20 218 lb 3.2 oz (99 kg)  04/29/20 211 lb 1.6 oz (95.8 kg)     Health Maintenance Due  Topic Date Due  . Hepatitis C Screening  Never done  . HIV Screening  Never done  . TETANUS/TDAP  Never done  . COLONOSCOPY (Pts 45-69yrs Insurance coverage will need to be confirmed)  Never done  . COVID-19 Vaccine (3 - Booster for Pfizer series) 05/03/2020    There are no preventive care reminders to display for this patient.  No results found for: TSH Lab Results  Component Value Date   WBC WILL FOLLOW 04/29/2020   WBC 8.9 04/29/2020   HGB WILL FOLLOW 04/29/2020   HGB 14.0 04/29/2020   HCT WILL FOLLOW 04/29/2020   HCT 41.9 04/29/2020   MCV WILL FOLLOW 04/29/2020   MCV 86 04/29/2020   PLT WILL FOLLOW 04/29/2020   PLT 388 04/29/2020   Lab Results  Component Value Date   NA 137 04/29/2020   K 4.7 04/29/2020   CO2 20 04/29/2020   GLUCOSE 105 (H) 04/29/2020   BUN 16 04/29/2020   CREATININE 1.23 04/29/2020   BILITOT 0.3 04/29/2020   ALKPHOS 59 04/29/2020   AST 17 04/29/2020   ALT 14 04/29/2020   PROT 6.1 04/29/2020   ALBUMIN 3.8 (L) 04/29/2020   CALCIUM 8.9 04/29/2020   ANIONGAP 9 09/30/2019    Lab Results  Component Value Date   CHOL 192 04/29/2020   Lab Results  Component Value Date   HDL 34 (L) 04/29/2020   Lab Results  Component Value Date   LDLCALC 129 (H) 04/29/2020   Lab Results  Component Value Date   TRIG 161 (H) 04/29/2020   Lab Results  Component Value Date   CHOLHDL 5.6 (H) 04/29/2020   Lab Results  Component Value Date   HGBA1C 6.1 (H) 04/29/2020      Assessment & Plan:   1. Tooth abscess - He will be treated prophylactically for dental abscess with Azithromycin. Educated on medication side effects and advised to notify clinic. He was advised to go to the ED for worsening symptoms. - azithromycin (ZITHROMAX) 250 MG tablet; Take 500 mg day 1, and 250 mg daily for 4 days.  Dispense: 6 tablet; Refill: 0     Follow-up: Return in about 3 weeks (around 06/17/2020), or if symptoms worsen or fail to improve.    Quinesha Selinger Trellis Paganini, NP

## 2020-05-28 ENCOUNTER — Other Ambulatory Visit: Payer: Self-pay

## 2020-06-11 ENCOUNTER — Ambulatory Visit: Payer: Medicaid Other | Admitting: Gerontology

## 2020-06-17 ENCOUNTER — Ambulatory Visit: Payer: Medicaid Other | Admitting: Gerontology

## 2020-07-29 ENCOUNTER — Other Ambulatory Visit: Payer: Medicaid Other

## 2020-07-30 ENCOUNTER — Other Ambulatory Visit: Payer: Self-pay

## 2020-07-30 ENCOUNTER — Ambulatory Visit: Payer: Self-pay

## 2020-07-30 DIAGNOSIS — Z0000 Encounter for general adult medical examination without abnormal findings: Secondary | ICD-10-CM

## 2020-07-30 LAB — POCT URINALYSIS DIPSTICK
Bilirubin, UA: NEGATIVE
Blood, UA: NEGATIVE
Glucose, UA: NEGATIVE
Ketones, UA: NEGATIVE
Leukocytes, UA: NEGATIVE
Nitrite, UA: NEGATIVE
Protein, UA: NEGATIVE
Spec Grav, UA: 1.025 (ref 1.010–1.025)
Urobilinogen, UA: 0.2 E.U./dL
pH, UA: 5.5 (ref 5.0–8.0)

## 2020-07-30 NOTE — Progress Notes (Signed)
Scheduled to complete physical 08/03/20 with Durward Parcel, PA-C.  AMD

## 2020-07-31 LAB — CMP12+LP+TP+TSH+6AC+PSA+CBC…
ALT: 18 IU/L (ref 0–44)
AST: 22 IU/L (ref 0–40)
Albumin/Globulin Ratio: 1.9 (ref 1.2–2.2)
Albumin: 3.8 g/dL — ABNORMAL LOW (ref 4.0–5.0)
Alkaline Phosphatase: 49 IU/L (ref 44–121)
BUN/Creatinine Ratio: 8 — ABNORMAL LOW (ref 9–20)
BUN: 10 mg/dL (ref 6–24)
Basophils Absolute: 0 10*3/uL (ref 0.0–0.2)
Basos: 0 %
Bilirubin Total: 0.6 mg/dL (ref 0.0–1.2)
Calcium: 8.4 mg/dL — ABNORMAL LOW (ref 8.7–10.2)
Chloride: 105 mmol/L (ref 96–106)
Chol/HDL Ratio: 6.1 ratio — ABNORMAL HIGH (ref 0.0–5.0)
Cholesterol, Total: 172 mg/dL (ref 100–199)
Creatinine, Ser: 1.25 mg/dL (ref 0.76–1.27)
EOS (ABSOLUTE): 0.1 10*3/uL (ref 0.0–0.4)
Eos: 1 %
Estimated CHD Risk: 1.3 times avg. — ABNORMAL HIGH (ref 0.0–1.0)
Free Thyroxine Index: 1.7 (ref 1.2–4.9)
GGT: 22 IU/L (ref 0–65)
Globulin, Total: 2 g/dL (ref 1.5–4.5)
Glucose: 96 mg/dL (ref 65–99)
HDL: 28 mg/dL — ABNORMAL LOW (ref >39)
Hematocrit: 43.4 % (ref 37.5–51.0)
Hemoglobin: 14.6 g/dL (ref 13.0–17.7)
Immature Grans (Abs): 0 10*3/uL (ref 0.0–0.1)
Immature Granulocytes: 0 %
Iron: 75 ug/dL (ref 38–169)
LDH: 254 IU/L — ABNORMAL HIGH (ref 121–224)
LDL Chol Calc (NIH): 114 mg/dL — ABNORMAL HIGH (ref 0–99)
Lymphocytes Absolute: 2.1 10*3/uL (ref 0.7–3.1)
Lymphs: 21 %
MCH: 29.3 pg (ref 26.6–33.0)
MCHC: 33.6 g/dL (ref 31.5–35.7)
MCV: 87 fL (ref 79–97)
Monocytes Absolute: 0.6 10*3/uL (ref 0.1–0.9)
Monocytes: 5 %
Neutrophils Absolute: 7.4 10*3/uL — ABNORMAL HIGH (ref 1.4–7.0)
Neutrophils: 73 %
Phosphorus: 3.5 mg/dL (ref 2.8–4.1)
Platelets: 365 10*3/uL (ref 150–450)
Potassium: 4.4 mmol/L (ref 3.5–5.2)
Prostate Specific Ag, Serum: 2.2 ng/mL (ref 0.0–4.0)
RBC: 4.99 x10E6/uL (ref 4.14–5.80)
RDW: 13.2 % (ref 11.6–15.4)
Sodium: 137 mmol/L (ref 134–144)
T3 Uptake Ratio: 27 % (ref 24–39)
T4, Total: 6.2 ug/dL (ref 4.5–12.0)
TSH: 0.537 u[IU]/mL (ref 0.450–4.500)
Total Protein: 5.8 g/dL — ABNORMAL LOW (ref 6.0–8.5)
Triglycerides: 170 mg/dL — ABNORMAL HIGH (ref 0–149)
Uric Acid: 6.6 mg/dL (ref 3.8–8.4)
VLDL Cholesterol Cal: 30 mg/dL (ref 5–40)
WBC: 10.3 10*3/uL (ref 3.4–10.8)
eGFR: 70 mL/min/{1.73_m2} (ref >59)

## 2020-08-03 ENCOUNTER — Ambulatory Visit: Payer: Self-pay | Admitting: Physician Assistant

## 2020-08-03 ENCOUNTER — Encounter: Payer: Self-pay | Admitting: Physician Assistant

## 2020-08-03 ENCOUNTER — Other Ambulatory Visit: Payer: Self-pay

## 2020-08-03 VITALS — BP 143/97 | HR 96 | Temp 99.1°F | Resp 14 | Ht 67.0 in | Wt 218.0 lb

## 2020-08-03 DIAGNOSIS — Z1211 Encounter for screening for malignant neoplasm of colon: Secondary | ICD-10-CM

## 2020-08-03 DIAGNOSIS — Z0000 Encounter for general adult medical examination without abnormal findings: Secondary | ICD-10-CM

## 2020-08-03 MED ORDER — VARENICLINE TARTRATE 1 MG PO TABS: 1.0000 mg | ORAL_TABLET | Freq: Every day | ORAL | 2 refills | Status: AC

## 2020-08-03 MED ORDER — ATORVASTATIN CALCIUM 10 MG PO TABS: 10.0000 mg | ORAL_TABLET | Freq: Every day | ORAL | 3 refills | Status: AC

## 2020-08-03 MED ORDER — VARENICLINE TARTRATE 0.5 MG PO TABS
0.5000 mg | ORAL_TABLET | Freq: Two times a day (BID) | ORAL | 0 refills | Status: AC
Start: 2020-08-03 — End: ?

## 2020-08-03 NOTE — Progress Notes (Signed)
   Subjective: Annual physical exam    Patient ID: David Huerta, male    DOB: 1970-06-13, 50 y.o.   MRN: [id number]  HPI Patient presents for annual physical exam.  Patient requests refill of medication for lipid disorder.  Patient also requesting consult for colonoscopy.   Review of Systems Hyperlipidemia    Objective:   Physical Exam No acute distress.  Temperature 99.1, pulse 96, respiration 14, BP is 143/97, patient 96% O2 sat on room air.  Patient weighs 218 pounds.  BMI is 34.1. HEENT is unremarkable.  Neck is supple active Methe[place] or bruits.  Lungs clear to auscultation.  Heart regular rate and rhythm.  No acute findings on EKG. Abdomen slightly distended secondary to body habitus, normoactive bowel sounds, soft, and nontender to palpation. No obvious deformity to the upper or lower extremities.  Patient has full and equal range of motion upper and lower extremities. No obvious deformity of the cervical lumbar spine.  Patient has full and equal range of motion cervical lumbar spine. Cranial nerves II through XII grossly intact.  DTRs are 2+ without clonus.      Assessment & Plan: Well exam.   Discussed patient lab results showed elevated triglycerides and low HDL.  Patient is amenable to switching from Crestor to Lipitor we will follow-up in 3 months.  Patient also given advice on how to obtain the shingles shot.  A consult for colonoscopy will be evaluated.  Patient will follow up as directed.

## 2020-08-03 NOTE — Addendum Note (Signed)
Addended by: Joni Reining on: 08/03/2020 09:42 AM   Modules accepted: Orders

## 2020-08-03 NOTE — Addendum Note (Signed)
Addended by: Gardner Candle on: 08/03/2020 02:09 PM   Modules accepted: Orders

## 2020-08-03 NOTE — Progress Notes (Signed)
Pt presents today to complete annual physical with Ron smith,PA-C.  Reviewed CDC recommendations for importance of HIV/ Hep C screening once in lifetime. Patient has declined HIV / Hep C screenings today and will let us know if they should change their mind in the future.    Education provided to patient today regarding importance of recommended colonoscopy screening. Patient wishes to discuss further with provider. Ron [facility] agrees to follow up with patient on colonoscopy screening.  Education provided to patient today regarding importance of recommended shingles vaccine per age. Pt will discuss this with provider. CL,RMA

## 2020-08-05 ENCOUNTER — Other Ambulatory Visit: Payer: Self-pay | Admitting: Physician Assistant

## 2020-08-05 MED ORDER — VARENICLINE TARTRATE 0.5 MG X 11 & 1 MG X 42 PO MISC: ORAL | 0 refills | Status: AC

## 2020-09-14 ENCOUNTER — Telehealth: Payer: Self-pay | Admitting: Pharmacist

## 2020-09-14 NOTE — Telephone Encounter (Signed)
Patient has prescription drug coverage. No longer meets the eligibility requirements to obtain medication assistance from [facility]. Patient notified by letter.  [facility] health practitioner

## 2020-09-15 ENCOUNTER — Other Ambulatory Visit: Payer: Self-pay

## 2020-10-06 ENCOUNTER — Other Ambulatory Visit: Payer: Self-pay

## 2020-10-06 DIAGNOSIS — Z0283 Encounter for blood-alcohol and blood-drug test: Secondary | ICD-10-CM

## 2020-10-06 NOTE — Progress Notes (Signed)
Presents to [facility] for Random DOT Drug Screen.  [facility] Acct #:  [id number] [facility] Specimen #:  [id number]  AMD

## 2021-01-04 DIAGNOSIS — M13811 Other specified arthritis, right shoulder: Secondary | ICD-10-CM | POA: Diagnosis not present

## 2021-01-04 DIAGNOSIS — M25511 Pain in right shoulder: Secondary | ICD-10-CM | POA: Diagnosis not present

## 2021-01-04 DIAGNOSIS — M7591 Shoulder lesion, unspecified, right shoulder: Secondary | ICD-10-CM | POA: Diagnosis not present

## 2021-01-04 DIAGNOSIS — M24811 Other specific joint derangements of right shoulder, not elsewhere classified: Secondary | ICD-10-CM | POA: Diagnosis not present

## 2022-06-07 ENCOUNTER — Telehealth: Payer: Self-pay

## 2022-06-07 NOTE — Telephone Encounter (Signed)
Spoke with patient who declined to schedule apt with PCP-states he is being seen outside of [facility]. AS, CMA
